# Patient Record
Sex: Female | Born: 1985 | Race: White | Hispanic: No | Marital: Single | Smoking: Current every day smoker
Health system: Southern US, Community
[De-identification: ages and names within clinical notes are randomized; demographics above are authoritative.]

## PROBLEM LIST (undated history)

## (undated) DIAGNOSIS — G35 Multiple sclerosis: Secondary | ICD-10-CM

---
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
## 2006-02-16 ENCOUNTER — Inpatient Hospital Stay (HOSPITAL_COMMUNITY): Admission: AD | Admit: 2006-02-16 | Discharge: 2006-02-16 | Payer: Self-pay | Admitting: *Deleted

## 2006-02-16 ENCOUNTER — Ambulatory Visit: Payer: Self-pay | Admitting: Family Medicine

## 2006-03-06 ENCOUNTER — Inpatient Hospital Stay (HOSPITAL_COMMUNITY): Admission: AD | Admit: 2006-03-06 | Discharge: 2006-03-09 | Payer: Self-pay | Admitting: Obstetrics and Gynecology

## 2006-03-06 ENCOUNTER — Ambulatory Visit: Payer: Self-pay | Admitting: Obstetrics and Gynecology

## 2006-03-06 ENCOUNTER — Encounter (INDEPENDENT_AMBULATORY_CARE_PROVIDER_SITE_OTHER): Payer: Self-pay | Admitting: Specialist

## 2007-04-02 ENCOUNTER — Emergency Department (HOSPITAL_COMMUNITY): Admission: EM | Admit: 2007-04-02 | Discharge: 2007-04-02 | Payer: Self-pay | Admitting: Emergency Medicine

## 2007-04-05 ENCOUNTER — Emergency Department (HOSPITAL_COMMUNITY): Admission: EM | Admit: 2007-04-05 | Discharge: 2007-04-05 | Payer: Self-pay | Admitting: Emergency Medicine

## 2007-10-18 ENCOUNTER — Emergency Department (HOSPITAL_COMMUNITY): Admission: EM | Admit: 2007-10-18 | Discharge: 2007-10-18 | Payer: Self-pay | Admitting: Emergency Medicine
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
## 2009-04-22 ENCOUNTER — Emergency Department (HOSPITAL_COMMUNITY): Admission: EM | Admit: 2009-04-22 | Discharge: 2009-04-22 | Payer: Self-pay | Admitting: Emergency Medicine

## 2009-10-17 ENCOUNTER — Emergency Department (HOSPITAL_COMMUNITY): Admission: EM | Admit: 2009-10-17 | Discharge: 2009-10-18 | Payer: Self-pay | Admitting: Emergency Medicine

## 2009-10-18 ENCOUNTER — Ambulatory Visit: Payer: Self-pay | Admitting: Psychiatry

## 2009-10-18 ENCOUNTER — Inpatient Hospital Stay (HOSPITAL_COMMUNITY): Admission: AD | Admit: 2009-10-18 | Discharge: 2009-10-19 | Payer: Self-pay | Admitting: Psychiatry

## 2011-02-24 LAB — RAPID URINE DRUG SCREEN, HOSP PERFORMED: Barbiturates: NOT DETECTED

## 2011-02-24 LAB — URINALYSIS, ROUTINE W REFLEX MICROSCOPIC
Glucose, UA: NEGATIVE mg/dL
Ketones, ur: NEGATIVE mg/dL
Nitrite: NEGATIVE
Protein, ur: NEGATIVE mg/dL
Urobilinogen, UA: 0.2 mg/dL (ref 0.0–1.0)

## 2011-02-24 LAB — ETHANOL
Alcohol, Ethyl (B): 163 mg/dL — ABNORMAL HIGH (ref 0–10)
Alcohol, Ethyl (B): 229 mg/dL — ABNORMAL HIGH (ref 0–10)

## 2011-02-24 LAB — BASIC METABOLIC PANEL
BUN: 8 mg/dL (ref 6–23)
Calcium: 8.7 mg/dL (ref 8.4–10.5)
Chloride: 110 mEq/L (ref 96–112)
Creatinine, Ser: 0.55 mg/dL (ref 0.4–1.2)
GFR calc Af Amer: >60 mL/min (ref >60)

## 2011-02-24 LAB — DIFFERENTIAL
Basophils Relative: 1 % (ref 0–1)
Eosinophils Absolute: 0.3 10*3/uL (ref 0.0–0.7)
Monocytes Relative: 6 % (ref 3–12)
Neutro Abs: 7.6 10*3/uL (ref 1.7–7.7)
Neutrophils Relative %: 67 % (ref 43–77)

## 2011-02-24 LAB — CBC
MCHC: 33.8 g/dL (ref 30.0–36.0)
MCV: 92.7 fL (ref 78.0–100.0)
Platelets: 213 10*3/uL (ref 150–400)
RBC: 4.56 MIL/uL (ref 3.87–5.11)
WBC: 11.3 10*3/uL — ABNORMAL HIGH (ref 4.0–10.5)

## 2011-02-24 LAB — PREGNANCY, URINE: Preg Test, Ur: NEGATIVE

## 2011-04-09 NOTE — Op Note (Signed)
[name], Sue Garcia            ACCOUNT NO.:  [id number]   MEDICAL RECORD NO.:  [id number]          PATIENT TYPE:  INP   LOCATION:  9147                          FACILITY:  [facility]   PHYSICIAN:  Phil D. Okey Dupre, M.D.     DATE OF BIRTH:  Jun 19, 1986   DATE OF PROCEDURE:  03/06/2006  DATE OF DISCHARGE:                                 OPERATIVE REPORT   PROCEDURE:  Low transverse cesarean section.   PREOPERATIVE DIAGNOSIS:  Severe persistent fetal bradycardia.   POSTOPERATIVE DIAGNOSES:  1.  Severe persistent fetal bradycardia.  2.  Heavy meconium.   SURGEON:  Javier Glazier. Okey Dupre, M.D.   FIRST ASSISTANT:  Darl Pikes Christmas, C.N.M.   ANESTHESIA:  Spinal.   SPECIMENS TO PATHOLOGY:  Placenta and cord.   ESTIMATED BLOOD LOSS:  400 mL.   POSTOPERATIVE CONDITION:  Satisfactory.   REASON FOR PROCEDURE:  This is a 25 year old primigravida white female at  term who when arrived on the floor from the MICU, had a severe peripheral  fetal bradycardia in the 50s that did not come up above that.  It slowly  rose and reviewing her chart since she came, she had some late decelerations  in the MICU shortly before her episode of bradycardia.  The patient was only  7-cm dilated, so the chance of success without section was very low.  The  patient had thick meconium and membranes were ruptured.  Procedure was as  follows:   DESCRIPTION OF PROCEDURE:  Under satisfactory spinal anesthesia, the patient  in a dorsal supine position, a Foley catheter in the urinary bladder, the  abdomen was prepped and draped in the usual sterile manner and entered  through a transverse suprapubic Pfannenstiel incision situated 3 cm above  the symphysis pubis and extending for the extent of the length of 14 cm.  The abdomen was entered by layers and on entering the peritoneal cavity, the  visceroperitoneum and the anterior surface uterus were opened transversely,  the bladder pushed away the lower uterine segment, which was  entered by  sharp and blunt dissection and from a LOT presentation after thick meconium  exuded immediately from the uterus upon entry, the baby was delivered, the  mouth and nares sucked out with DeLee suction, cord doubly clamped, divided,  baby handed to the pediatrician.  The cord was found to be hyperspiral, very  floppy with no tone and very thin.  A small placenta was marked meconium  staining was delivered.  The uterus was explored and closed with a  continuous running 0 Vicryl in an atraumatic needle.  Area was observed for  bleeding, none was noted and the fascia was closed with a continuous running  0 Vicryl suture in an  atraumatic needle.  Skin edges were approximated with skin staples after  subcutaneous bleeders were controlled with hot cautery.  Dry sterile  dressing was applied with a total blood loss of approximately 400 mL.  The  patient tolerated the procedure well and was transferred to recovery room in  satisfactory condition.           ______________________________  Phil D. Okey Dupre, M.D.     PDR/MEDQ  D:  03/06/2006  T:  03/07/2006  Job:  [id number]

## 2011-04-09 NOTE — Discharge Summary (Signed)
NAMEALEC, MCPHEE            ACCOUNT NO.:  [id number]   MEDICAL RECORD NO.:  [id number]          PATIENT TYPE:  INP   LOCATION:  9147                          [facility]ACILITY:  [facility]   PHYSICIAN:  Phil D. Okey Dupre, M.D.     DATE OF BIRTH:  July 13, 1986   DATE OF ADMISSION:  03/06/2006  DATE OF DISCHARGE:  03/09/2006                                 DISCHARGE SUMMARY   ADMISSION DIAGNOSES:  1.  Onset of labor.  2.  Severe fetal bradycardia.  3.  Heavy thick meconium.   DISCHARGE DIAGNOSES:  1.  Low transverse C-section on March 06, 2006 secondary to severe fetal      bradycardia as well as heavy thick stained meconium.  2.  Term pregnancy delivered at 61 and 6 weeks.   HOSPITAL COURSE:  On March 06, 2006 at 0620 a.m., this G1 now P61, 25-year-  old female delivered a viable female at 81 and 6 with Apgar's of 7 and 9. She  delivered via low transverse C-section secondary to fetal bradycardia that  dropped down to 60s and remained down and did not return to baseline. There  was also noted to be heavy thick meconium throughout the C-section. Placenta  delivered spontaneously at 0604. The baby's nose and mouth were suctioned  with DeLee as well as the cord clamped and cut. There was hyperspiraling of  the cord noted as well as floppiness of the cord. The patient was stable and  the infant was transferred to the neonatal intensive care unit. Estimated  blood loss was less than 400 mL. Prior to discharge, the patient was  afebrile with the following vital signs. T-max of 98.9.  Blood pressure 100-  110 over 56-76 with a heart rate between 75 and 86.  The patient had no  postoperative complications.  Her incision was clean, dry and intact and her  staples will be removed prior to discharge. She was satting 100% on room  air.   LABORATORY DATA:  Pertinent discharge lab work include hemoglobin of 12.3.  Patient was B+, rubella immune, delivered a viable female who weighed 5 pounds  and 13 ounces, 2650  grams and is 19-1/2 inches long.   DISCHARGE MEDICATIONS:  1.  Ibuprofen 600 mg by mouth every 6 hours as needed for cramps.  2.  Percocet 5/325 1 tablet by mouth every 6 hours as needed for pain.  3.  Micronor 0.35 mg p.o. daily, prescription given for 30 with one refill      until the patient is seen at [facility] in 4 weeks.  4.  Prenatal vitamin daily while the patient is breast feeding or pumping      for the infant in the NICU.  5.  Colace 100 mg p.o. b.i.d.   The patient was instructed to have no heavy lifting greater than 20 pounds  for 2 weeks and nothing in the vagina for 6 weeks.  She is to follow up at  [facility] in 4 weeks and also given the number to contact Dr. Cherlyn Roberts for severe acne.  She was given the  number [phone] to call and to  schedule both appointments.      Alanson Puls, M.D.    ______________________________  Javier Glazier. Okey Dupre, M.D.    MR/MEDQ  D:  03/09/2006  T:  03/10/2006  Job:  [id number]

## 2015-04-07 ENCOUNTER — Encounter (HOSPITAL_COMMUNITY): Payer: Self-pay | Admitting: Emergency Medicine

## 2015-04-07 ENCOUNTER — Emergency Department (HOSPITAL_COMMUNITY)
Admission: EM | Admit: 2015-04-07 | Discharge: 2015-04-07 | Disposition: A | Discharge status: Home / Self Care | Payer: BLUE CROSS/BLUE SHIELD | Attending: Emergency Medicine | Admitting: Emergency Medicine

## 2015-04-07 DIAGNOSIS — Z720 Tobacco use: Secondary | ICD-10-CM | POA: Diagnosis not present

## 2015-04-07 DIAGNOSIS — G35 Multiple sclerosis: Secondary | ICD-10-CM | POA: Diagnosis not present

## 2015-04-07 DIAGNOSIS — R52 Pain, unspecified: Secondary | ICD-10-CM | POA: Diagnosis present

## 2015-04-07 DIAGNOSIS — R197 Diarrhea, unspecified: Secondary | ICD-10-CM | POA: Insufficient documentation

## 2015-04-07 HISTORY — DX: Multiple sclerosis: G35

## 2015-04-07 MED ORDER — HYDROCODONE-ACETAMINOPHEN 5-325 MG PO TABS: ORAL_TABLET | ORAL | Status: AC

## 2015-04-07 MED ORDER — TIZANIDINE HCL 4 MG PO TABS: 4.0000 mg | ORAL_TABLET | Freq: Three times a day (TID) | ORAL | Status: AC

## 2015-04-07 MED ORDER — KETOROLAC TROMETHAMINE 10 MG PO TABS
10.0000 mg | ORAL_TABLET | Freq: Once | ORAL | Status: AC
  Administered 2015-04-07: 10 mg via ORAL
  Filled 2015-04-07: qty 1

## 2015-04-07 MED ORDER — PROMETHAZINE HCL 25 MG PO TABS
12.5000 mg | ORAL_TABLET | Freq: Once | ORAL | Status: AC
  Administered 2015-04-07: 12.5 mg via ORAL
  Filled 2015-04-07: qty 1

## 2015-04-07 MED ORDER — HYDROCODONE-ACETAMINOPHEN 5-325 MG PO TABS
2.0000 | ORAL_TABLET | Freq: Once | ORAL | Status: AC
  Administered 2015-04-07: 2 via ORAL
  Filled 2015-04-07: qty 2

## 2015-04-07 NOTE — ED Provider Notes (Signed)
CSN: [id number]     Arrival date & time 04/07/15  1135 History   First MD Initiated Contact with Patient 04/07/15 1227     Chief Complaint  Patient presents with  . generalized body pain      (Consider location/radiation/quality/duration/timing/severity/associated sxs/prior Treatment) HPI Comments: Patient is a 29 year old female who presents to the emergency department with a complaint of generalized body pain. The patient states that she has been diagnosed with multiple sclerosis for nearly a year. She states her last MRI revealed 15 lesions on the brain. She is treated for this in [place]. She is currently in [place] to take care of some business affairs involving her child and her grandmother. She states that she left her medications in [place] and she is having an "multiple sclerosis flareup". She states that she is usually in pain almost 24 hours a day, but is able to keep her pain down some control with her medications. She has not had any recent fever, chills, unusual rash. She complains of some headache, and has had 3 episodes of diarrhea today and an episode of diarrhea on yesterday. She states that diarrhea is not uncommon for her. There's been no vomiting reported. She presents at this time for assistance with her pain and discomfort.  The history is provided by the patient.    Past Medical History  Diagnosis Date  . MS (multiple sclerosis)    History reviewed. No pertinent past surgical history. No family history on file. History  Substance Use Topics  . Smoking status: Current Every Day Smoker    Types: Cigarettes  . Smokeless tobacco: Not on file  . Alcohol Use: No   OB History    No data available     Review of Systems  Gastrointestinal: Positive for diarrhea.  Musculoskeletal: Positive for arthralgias and gait problem.  Neurological: Positive for weakness.  All other systems reviewed and are negative.     Allergies  Prednisone  Home  Medications   Prior to Admission medications   Not on File   BP 99/62 mmHg  Pulse 86  Temp(Src) 98.5 F (36.9 C) (Oral)  Resp 20  Ht 5' 3.5" (1.613 m)  Wt 117 lb (53.071 kg)  BMI 20.40 kg/m2  SpO2 100% Physical Exam  Constitutional: She is oriented to person, place, and time. She appears well-developed and well-nourished.  Non-toxic appearance.  HENT:  Head: Normocephalic.  Right Ear: Tympanic membrane and external ear normal.  Left Ear: Tympanic membrane and external ear normal.  Eyes: EOM and lids are normal. Pupils are equal, round, and reactive to light.  Neck: Normal range of motion. Neck supple. Carotid bruit is not present.  Cardiovascular: Normal rate, regular rhythm, normal heart sounds, intact distal pulses and normal pulses.   Pulmonary/Chest: Breath sounds normal. No respiratory distress.  Abdominal: Soft. Bowel sounds are normal. There is no tenderness. There is no guarding.  Musculoskeletal: Normal range of motion.  Some spasticity noted of upper and lower extremities. Right lower extremity more than left lower extremity.  Lymphadenopathy:       Head (right side): No submandibular adenopathy present.       Head (left side): No submandibular adenopathy present.    She has no cervical adenopathy.  Neurological: She is alert and oriented to person, place, and time. She has normal strength. No cranial nerve deficit or sensory deficit.  Skin: Skin is warm and dry.  Psychiatric: She has a normal mood and affect. Her speech  is normal.  Nursing note and vitals reviewed.   ED Course  Procedures (including critical care time) Labs Review Labs Reviewed - No data to display  Imaging Review No results found.   EKG Interpretation None      MDM  Vital signs are well within normal limits. There is symmetrical rise and fall of the chest. The patient speaks in complete sentences without problem. There is some stiffness, pain, and spasticity with attempted range of  motion of the upper and lower extremities. The patient will be treated with Norco and Zanaflex. The patient is advised to see her neurology specialist as sone as she possibly can. The patient is in agreement with this discharge plan.    Final diagnoses:  None    *I have reviewed nursing notes, vital signs, and all appropriate lab and imaging results for this patient.[address] PA-C 04/07/15 1310  Toy Cookey, MD 04/08/15 [phone]

## 2015-04-07 NOTE — ED Notes (Signed)
Pt sts that when she has "MS relapses" she is usually given IV dilaudid and fluids and then "sent on her way." Pt sts we don't have her records because she is from [facility] and is up here visiting her grandparents and dealing with a DSS issue with her children. Pt A&Ox4.

## 2015-04-07 NOTE — Discharge Instructions (Signed)
Zanaflex and Norco may cause drowsiness, please use these medications with caution. Please see your neurologist as sone as possible. Multiple Sclerosis Multiple sclerosis (MS) is a disease of the central nervous system. It leads to the loss of the insulating covering of the nerves (myelin sheath) of your brain. When this happens, brain signals do not get sent properly or may not get sent at all. The age of onset of MS varies.  CAUSES The cause of MS is unknown. However, it is more common in the [place] than in the [place]. RISK FACTORS There is a higher number of women with MS than men. MS is not an illness that is passed down to you from your family members (inherited). However, your risk of MS is higher if you have a relative with MS. SIGNS AND SYMPTOMS  The symptoms of MS occur in episodes or attacks. These attacks may last weeks to months. There may be long periods of almost no symptoms between attacks. The symptoms of MS vary. This is because of the many different ways it affects the central nervous system. The main symptoms of MS include:  Vision problems and eye pain.  Numbness.  Weakness.  Inability to move your arms, hands, feet, or legs (paralysis).  Balance problems.  Tremors. DIAGNOSIS  Your health care provider can diagnose MS with the help of imaging exams and lab tests. These may include specialized X-ray exams and spinal fluid tests. The best imaging exam to confirm a diagnosis of MS is an MRI. TREATMENT  There is no known cure for MS, but there are medicines that can decrease the number and frequency of attacks. Steroids are often used for short-term relief. Physical and occupational therapy may also help. There are also many new alternative or complementary treatments available to help control the symptoms of MS. Ask your health care provider if any of these other options are right for you. HOME CARE INSTRUCTIONS   Take medicines as directed  by your health care provider.  Exercise as directed by your health care provider. SEEK MEDICAL CARE IF: You begin to feel depressed. SEEK IMMEDIATE MEDICAL CARE IF:  You develop paralysis.  You have problems with bladder, bowel, or sexual function.  You develop mental changes, such as forgetfulness or mood swings.  You have a period of uncontrolled movements (seizure). Document Released: 11/05/2000 Document Revised: 11/13/2013 Document Reviewed: 07/16/2013 [facility] Patient Information 2015 [place], [place]. This information is not intended to replace advice given to you by your health care provider. Make sure you discuss any questions you have with your health care provider.

## 2015-04-07 NOTE — ED Notes (Signed)
Pt states that she thinks her MS is relapsing bc she has been having generalized body pain since Friday.
